# Patient Record
Sex: Female | Born: 2000 | Race: Black or African American | Hispanic: No | Marital: Single | Smoking: Never smoker
Health system: Southern US, Community
[De-identification: ages and names within clinical notes are randomized; demographics above are authoritative.]

## PROBLEM LIST (undated history)

## (undated) DIAGNOSIS — D649 Anemia, unspecified: Secondary | ICD-10-CM

---
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
## 2018-12-15 ENCOUNTER — Ambulatory Visit (HOSPITAL_COMMUNITY)
Admission: EM | Admit: 2018-12-15 | Discharge: 2018-12-15 | Disposition: A | Discharge status: Home / Self Care | Payer: BLUE CROSS/BLUE SHIELD | Attending: Family Medicine | Admitting: Family Medicine

## 2018-12-15 ENCOUNTER — Encounter (HOSPITAL_COMMUNITY): Payer: Self-pay

## 2018-12-15 DIAGNOSIS — Z113 Encounter for screening for infections with a predominantly sexual mode of transmission: Secondary | ICD-10-CM
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
## 2018-12-15 NOTE — ED Provider Notes (Signed)
[facility]    CSN: [id number] Arrival date & time: 12/15/18  1129     History   Chief Complaint Chief Complaint  Patient presents with  . SEXUALLY TRANSMITTED DISEASE    HPI Joy Lee is a 18 y.o. female.   3 weeks ago had unprotected intercourse and was concerned about an sti. Denies any current sx. Does not feel the need for hiv or preg test lmp 1/18. No abd pain, no fever, no vaginal discharge. Would like to have test results called to her.      Past Medical History:  Diagnosis Date  . Anemia     There are no active problems to display for this patient.   History reviewed. No pertinent surgical history.  OB History   No obstetric history on file.      Home Medications    Prior to Admission medications   Not on File    Family History No family history on file.  Social History Social History   Tobacco Use  . Smoking status: Never Smoker  . Smokeless tobacco: Never Used  Substance Use Topics  . Alcohol use: No  . Drug use: No     Allergies   Patient has no known allergies.   Review of Systems Review of Systems  Constitutional: Negative.   Respiratory: Negative.   Cardiovascular: Negative.   Gastrointestinal: Negative.   Genitourinary: Negative.   Musculoskeletal: Negative.   Neurological: Negative.      Physical Exam Triage Vital Signs ED Triage Vitals [12/15/18 1151]  Enc Vitals Group     BP 115/84     Pulse Rate 76     Resp 18     Temp 98.3 F (36.8 C)     Temp Source Oral     SpO2 100 %     Weight      Height      Head Circumference      Peak Flow      Pain Score 0     Pain Loc      Pain Edu?      Excl. in GC?    No data found.  Updated Vital Signs BP 115/84 (BP Location: Right Arm)   Pulse 76   Temp 98.3 F (36.8 C) (Oral)   Resp 18   LMP 12/04/2018   SpO2 100%   Visual Acuity Right Eye Distance:   Left Eye Distance:   Bilateral Distance:    Right Eye Near:   Left Eye Near:      Bilateral Near:     Physical Exam Cardiovascular:     Rate and Rhythm: Normal rate.     Pulses: Normal pulses.  Pulmonary:     Effort: Pulmonary effort is normal.  Abdominal:     General: Abdomen is flat.  Skin:    General: Skin is warm.  Neurological:     General: No focal deficit present.     Mental Status: She is alert.      UC Treatments / Results  Labs (all labs ordered are listed, but only abnormal results are displayed) Labs Reviewed  CERVICOVAGINAL ANCILLARY ONLY  URINE CYTOLOGY ANCILLARY ONLY    EKG None  Radiology No results found.  Procedures Procedures (including critical care time)  Medications Ordered in UC Medications - No data to display  Initial Impression / Assessment and Plan / UC Course  I have reviewed the triage vital signs and the nursing notes.  Pertinent labs & imaging  results that were available during my care of the patient were reviewed by me and considered in my medical decision making (see chart for details).     Discussed with pt about tx and sx pt did not want to have hiv testing or preg.  Denies any current sx  Final Clinical Impressions(s) / UC Diagnoses   Final diagnoses:  Screen for STD (sexually transmitted disease)     Discharge Instructions     We will call you if any results are positive and treat as needed     ED Prescriptions    None     Controlled Substance Prescriptions [place] Controlled Substance Registry consulted? Not Applicable   Coralyn MarkMitchell, Soua Lenk L, NP 12/15/18 1212

## 2018-12-15 NOTE — Discharge Instructions (Signed)
We will call you if any results are positive and treat as needed

## 2018-12-16 LAB — URINE CYTOLOGY ANCILLARY ONLY

## 2018-12-21 LAB — URINE CYTOLOGY ANCILLARY ONLY

## 2019-05-17 ENCOUNTER — Emergency Department (HOSPITAL_COMMUNITY): Payer: BC Managed Care – PPO

## 2019-05-17 ENCOUNTER — Emergency Department (HOSPITAL_COMMUNITY)
Admission: EM | Admit: 2019-05-17 | Discharge: 2019-05-17 | Disposition: A | Discharge status: Home / Self Care | Payer: BC Managed Care – PPO | Attending: Emergency Medicine | Admitting: Emergency Medicine

## 2019-05-17 ENCOUNTER — Other Ambulatory Visit: Payer: Self-pay

## 2019-05-17 ENCOUNTER — Encounter (HOSPITAL_COMMUNITY): Payer: Self-pay

## 2019-05-17 DIAGNOSIS — S60417A Abrasion of left little finger, initial encounter: Secondary | ICD-10-CM | POA: Diagnosis not present

## 2019-05-17 DIAGNOSIS — S50812A Abrasion of left forearm, initial encounter: Secondary | ICD-10-CM | POA: Insufficient documentation

## 2019-05-17 DIAGNOSIS — Y929 Unspecified place or not applicable: Secondary | ICD-10-CM | POA: Diagnosis not present

## 2019-05-17 DIAGNOSIS — Y999 Unspecified external cause status: Secondary | ICD-10-CM | POA: Insufficient documentation

## 2019-05-17 DIAGNOSIS — Z041 Encounter for examination and observation following transport accident: Secondary | ICD-10-CM | POA: Diagnosis present

## 2019-05-17 DIAGNOSIS — Y939 Activity, unspecified: Secondary | ICD-10-CM | POA: Insufficient documentation

## 2019-05-17 DIAGNOSIS — M25512 Pain in left shoulder: Secondary | ICD-10-CM | POA: Insufficient documentation

## 2019-05-17 DIAGNOSIS — M25532 Pain in left wrist: Secondary | ICD-10-CM | POA: Insufficient documentation

## 2019-05-17 MED ORDER — NAPROXEN 500 MG PO TABS: 500.0000 mg | ORAL_TABLET | Freq: Two times a day (BID) | ORAL | 0 refills | Status: AC
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
## 2019-05-17 NOTE — ED Notes (Signed)
Patient verbalizes understanding of discharge instructions. Opportunity for questioning and answers were provided. Armband removed by staff, pt discharged from ED ambulatory.   
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
## 2019-05-17 NOTE — ED Notes (Signed)
Patient transported to X-ray 
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
